# Patient Record
Sex: Male | Born: 1955 | Race: White | Hispanic: No | Marital: Single
Health system: Southern US, Community
[De-identification: ages and names within clinical notes are randomized; demographics above are authoritative.]

---

## 2005-08-15 ENCOUNTER — Ambulatory Visit: Payer: Self-pay | Admitting: Gastroenterology

## 2009-01-11 ENCOUNTER — Ambulatory Visit: Payer: Self-pay | Admitting: Internal Medicine

## 2009-02-08 ENCOUNTER — Inpatient Hospital Stay: Payer: Self-pay | Admitting: Internal Medicine

## 2009-02-10 ENCOUNTER — Ambulatory Visit: Payer: Self-pay | Admitting: Internal Medicine

## 2009-02-12 ENCOUNTER — Inpatient Hospital Stay: Payer: Self-pay | Admitting: Psychiatry

## 2009-02-16 ENCOUNTER — Ambulatory Visit: Payer: Self-pay | Admitting: Unknown Physician Specialty

## 2009-02-22 ENCOUNTER — Ambulatory Visit: Payer: Self-pay | Admitting: Internal Medicine

## 2009-03-13 ENCOUNTER — Ambulatory Visit: Payer: Self-pay | Admitting: Internal Medicine

## 2009-03-13 ENCOUNTER — Ambulatory Visit: Payer: Self-pay | Admitting: Unknown Physician Specialty

## 2009-04-12 ENCOUNTER — Ambulatory Visit: Payer: Self-pay | Admitting: Internal Medicine

## 2009-05-13 ENCOUNTER — Ambulatory Visit: Payer: Self-pay | Admitting: Internal Medicine

## 2009-06-13 ENCOUNTER — Ambulatory Visit: Payer: Self-pay | Admitting: Internal Medicine

## 2010-04-04 ENCOUNTER — Ambulatory Visit: Payer: Self-pay | Admitting: Podiatry

## 2010-04-23 ENCOUNTER — Inpatient Hospital Stay: Payer: Self-pay | Admitting: Internal Medicine

## 2011-11-22 ENCOUNTER — Ambulatory Visit: Payer: Self-pay | Admitting: Family Medicine

## 2011-11-22 LAB — CREATININE, SERUM
Creatinine: 1.13 mg/dL (ref 0.60–1.30)
EGFR (Non-African Amer.): >60

## 2011-12-21 ENCOUNTER — Other Ambulatory Visit: Payer: Self-pay | Admitting: Gastroenterology

## 2011-12-21 LAB — CLOSTRIDIUM DIFFICILE BY PCR

## 2011-12-31 ENCOUNTER — Ambulatory Visit: Payer: Self-pay | Admitting: Gastroenterology

## 2011-12-31 LAB — CBC WITH DIFFERENTIAL/PLATELET
Basophil #: 0.1 10*3/uL (ref 0.0–0.1)
Eosinophil %: 2.3 %
HCT: 39.3 % — ABNORMAL LOW (ref 40.0–52.0)
HGB: 13.2 g/dL (ref 13.0–18.0)
Lymphocyte %: 25.9 %
MCV: 95 fL (ref 80–100)
Monocyte %: 5.4 %
Neutrophil %: 65.2 %
Platelet: 113 10*3/uL — ABNORMAL LOW (ref 150–440)
RBC: 4.15 10*6/uL — ABNORMAL LOW (ref 4.40–5.90)
RDW: 13.2 % (ref 11.5–14.5)
WBC: 6.3 10*3/uL (ref 3.8–10.6)

## 2011-12-31 LAB — PROTIME-INR
INR: 1.2
Prothrombin Time: 16 secs — ABNORMAL HIGH (ref 11.5–14.7)

## 2012-01-01 ENCOUNTER — Ambulatory Visit: Payer: Self-pay | Admitting: Gastroenterology

## 2012-01-03 LAB — PATHOLOGY REPORT

## 2012-04-30 ENCOUNTER — Ambulatory Visit: Payer: Self-pay | Admitting: Ophthalmology

## 2012-04-30 LAB — POTASSIUM: Potassium: 4.5 mmol/L (ref 3.5–5.1)

## 2012-05-12 ENCOUNTER — Ambulatory Visit: Payer: Self-pay | Admitting: Ophthalmology

## 2012-09-16 ENCOUNTER — Ambulatory Visit: Payer: Self-pay | Admitting: Gastroenterology

## 2012-09-18 ENCOUNTER — Ambulatory Visit: Payer: Self-pay | Admitting: Internal Medicine

## 2012-09-23 ENCOUNTER — Ambulatory Visit: Payer: Self-pay | Admitting: Internal Medicine

## 2012-09-23 LAB — CBC CANCER CENTER
Basophil %: 0.8 %
Eosinophil %: 1.7 %
HCT: 43.8 % (ref 40.0–52.0)
HGB: 14.9 g/dL (ref 13.0–18.0)
MCH: 32.3 pg (ref 26.0–34.0)
MCHC: 34 g/dL (ref 32.0–36.0)
MCV: 95 fL (ref 80–100)
Neutrophil #: 5.3 x10 3/mm (ref 1.4–6.5)
Platelet: 107 x10 3/mm — ABNORMAL LOW (ref 150–440)
WBC: 8 x10 3/mm (ref 3.8–10.6)

## 2012-09-23 LAB — IRON AND TIBC
Iron Bind.Cap.(Total): 399 ug/dL (ref 250–450)
Iron Saturation: 19 %
Unbound Iron-Bind.Cap.: 325 ug/dL

## 2012-09-23 LAB — FERRITIN: Ferritin (ARMC): 141 ng/mL (ref 8–388)

## 2012-10-11 ENCOUNTER — Ambulatory Visit: Payer: Self-pay | Admitting: Internal Medicine

## 2012-10-29 LAB — CBC CANCER CENTER
Basophil %: 1 %
Eosinophil #: 0.1 x10 3/mm (ref 0.0–0.7)
Eosinophil %: 1.6 %
HCT: 41.6 % (ref 40.0–52.0)
Lymphocyte #: 1.3 x10 3/mm (ref 1.0–3.6)
Lymphocyte %: 24.6 %
MCHC: 34.8 g/dL (ref 32.0–36.0)
Monocyte #: 0.5 x10 3/mm (ref 0.2–1.0)
Monocyte %: 9.8 %
Neutrophil %: 63 %
Platelet: 54 x10 3/mm — ABNORMAL LOW (ref 150–440)
RBC: 4.32 10*6/uL — ABNORMAL LOW (ref 4.40–5.90)
RDW: 15 % — ABNORMAL HIGH (ref 11.5–14.5)
WBC: 5.4 x10 3/mm (ref 3.8–10.6)

## 2012-11-10 ENCOUNTER — Ambulatory Visit: Payer: Self-pay | Admitting: Internal Medicine

## 2012-11-18 LAB — CANCER CTR PLATELET CT: Platelet: 37 x10 3/mm — ABNORMAL LOW (ref 150–440)

## 2012-11-23 LAB — CANCER CTR PLATELET CT: Platelet: 50 x10 3/mm — ABNORMAL LOW (ref 150–440)

## 2012-11-26 LAB — CANCER CTR PLATELET CT: Platelet: 124 x10 3/mm — ABNORMAL LOW (ref 150–440)

## 2012-12-11 ENCOUNTER — Ambulatory Visit: Payer: Self-pay | Admitting: Internal Medicine

## 2013-01-11 DEATH — deceased

## 2013-07-30 IMAGING — US ABDOMEN ULTRASOUND
1 series · 13 of 25 positions shown · non-contrast
Comparison: none

REASON FOR EXAM: Thrombocytopenia
COMMENTS:

[Series 1: abdomen ultrasound · 0.28mm/px · 13 of 109 slices shown]
[im 1/109]
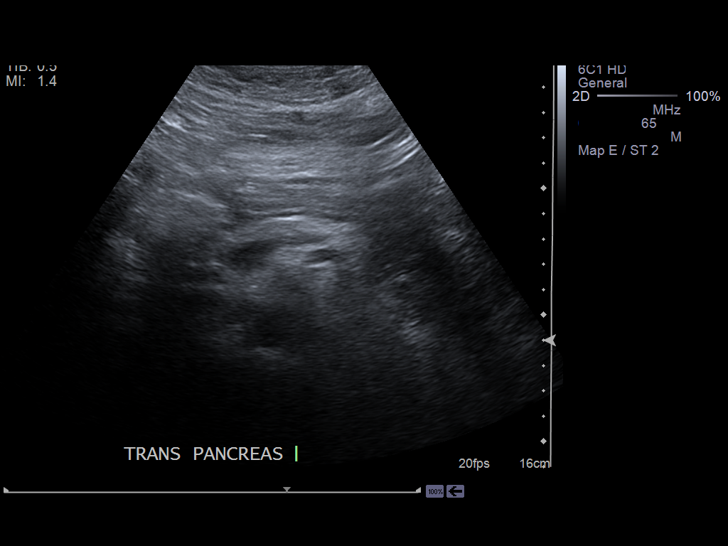
[im 10/109]
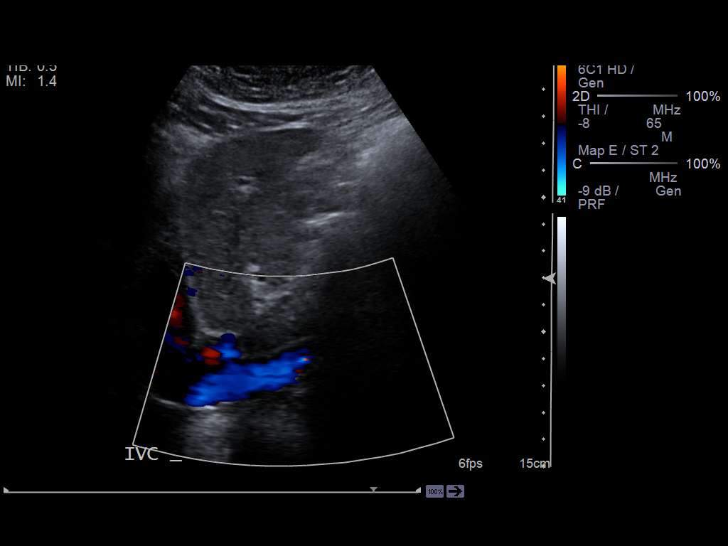
[im 19/109]
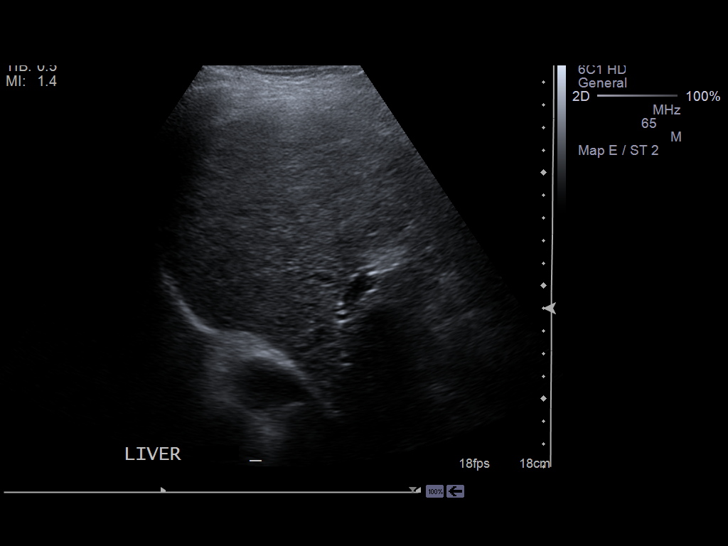
[im 28/109]
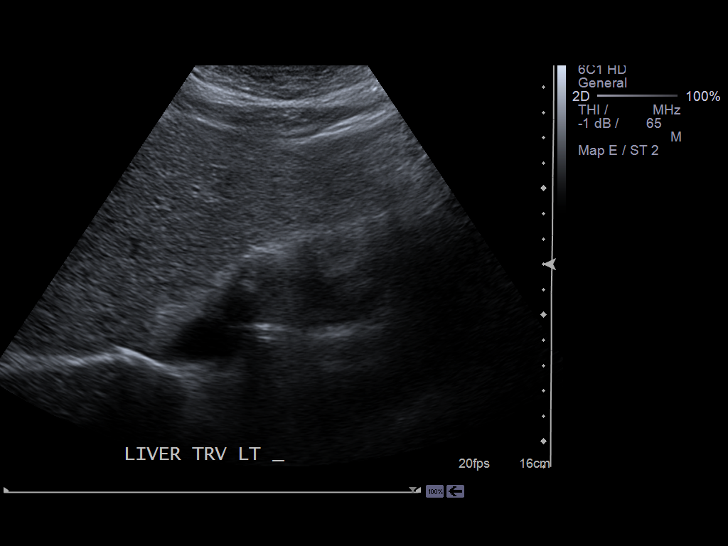
[im 37/109]
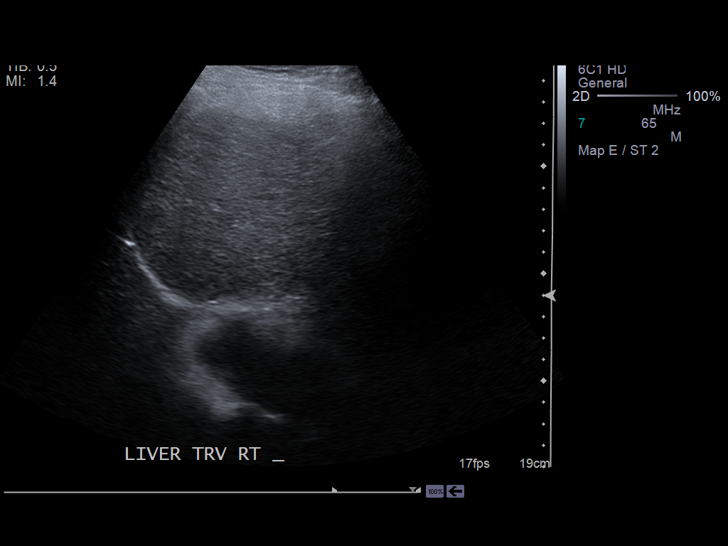
[im 46/109]
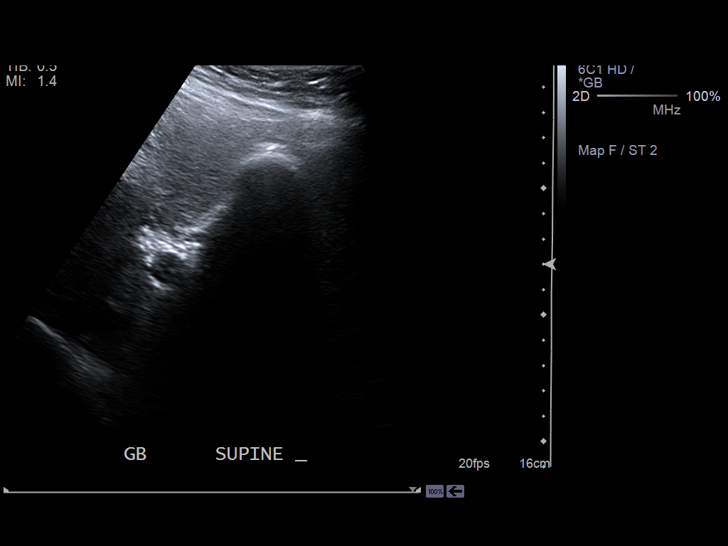
[im 55/109]
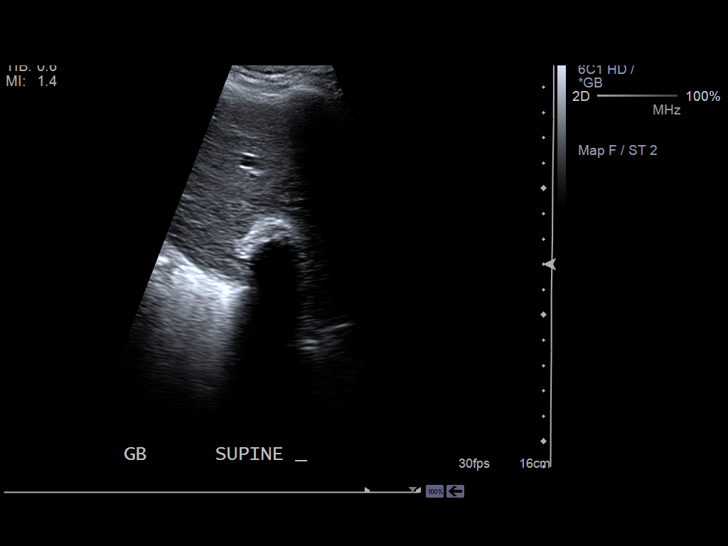
[im 64/109]
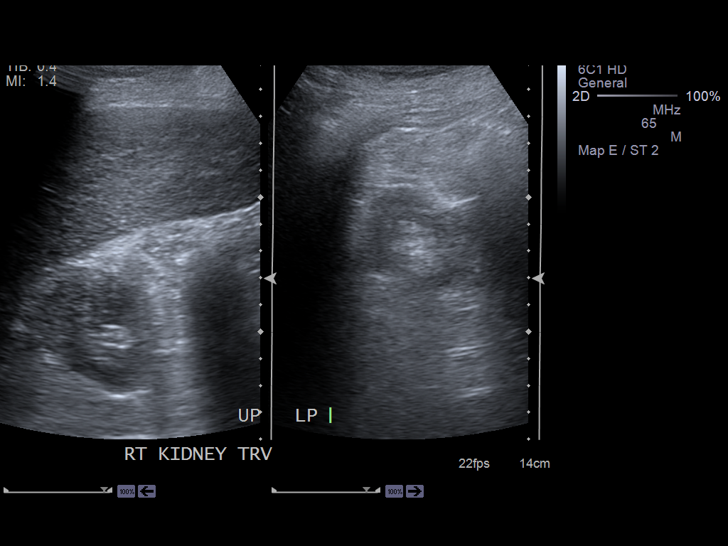
[im 73/109]
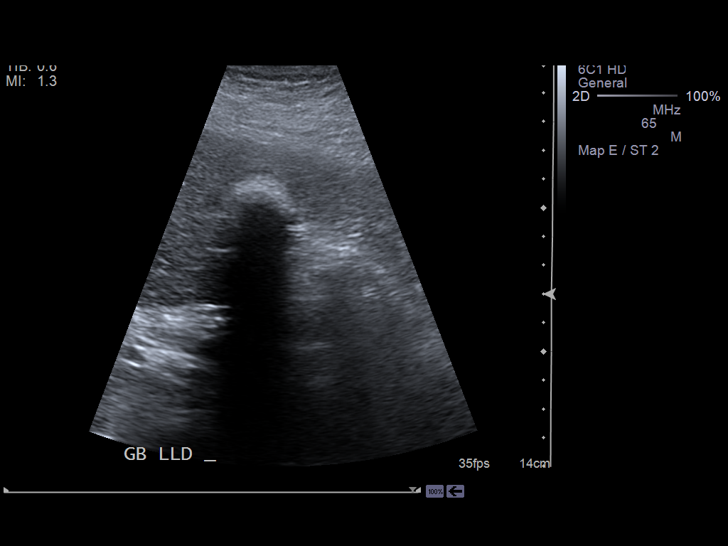
[im 82/109]
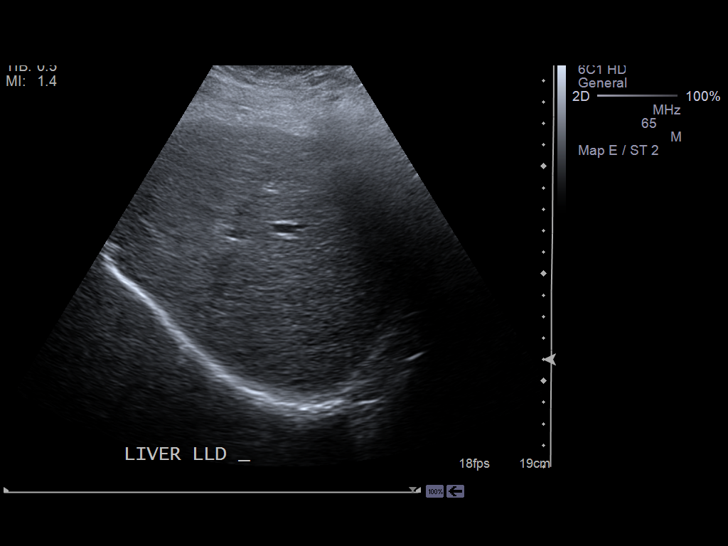
[im 91/109]
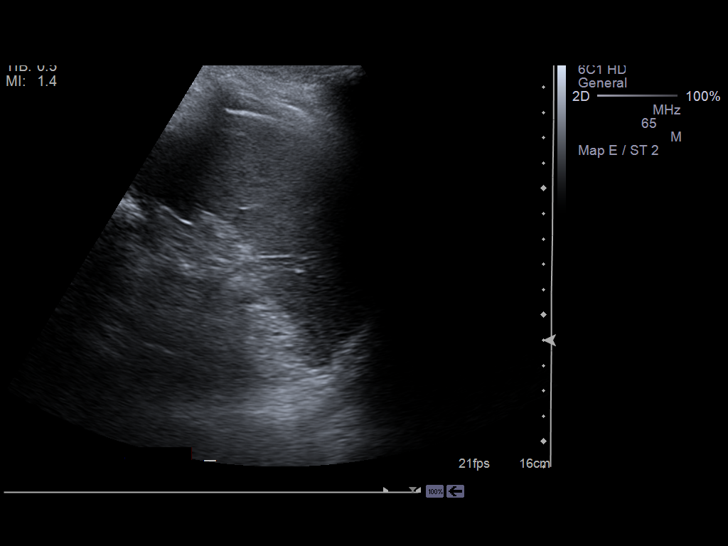
[im 100/109]
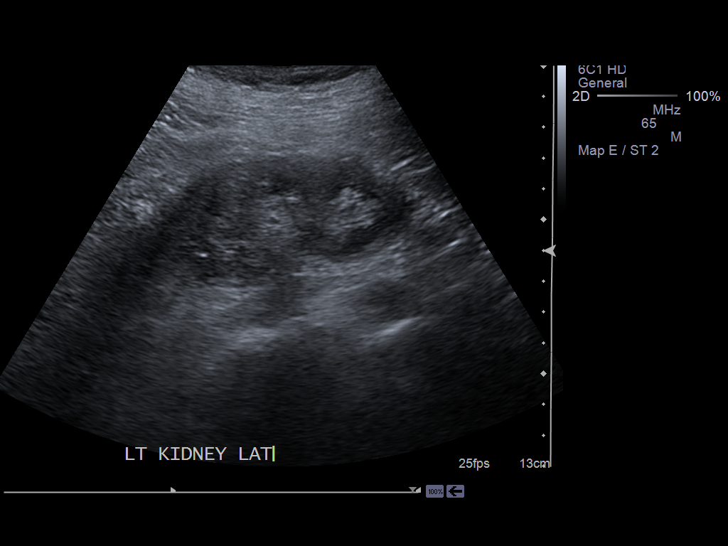
[im 109/109]
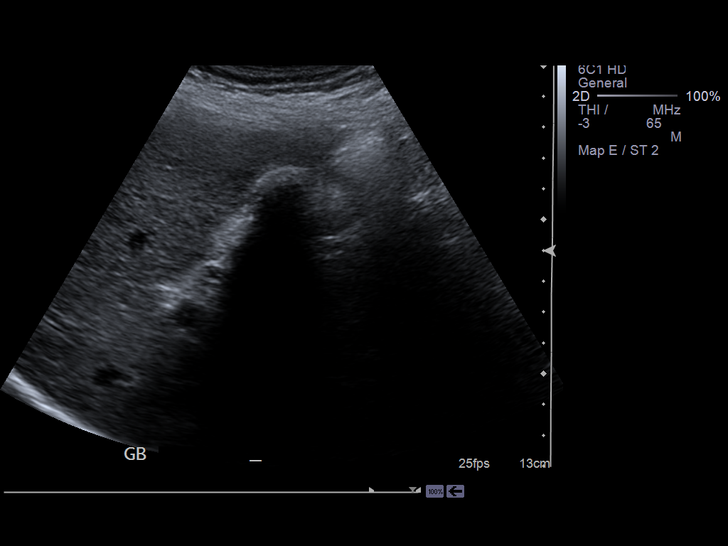

[13 of 25 positions shown; findings below may reference images not displayed]

PROCEDURE:     US  - US ABDOMEN GENERAL SURVEY  - September 16, 2012  [DATE]

RESULT:     Ultrasound of the abdomen is performed. The patient has previous
abdominal sonogram images from 24 April, 2010 for comparison.

The visualized body the pancreas appears normal. The head and tail are
poorly seen. The aorta shows normal caliber without aneurysm. There appears
to be evidence of tapering distally. The proximal inferior vena cava is
patent. The hepatic echotexture is coarse with a liver length of 15.60 cm
demonstrated. The liver appears to be somewhat echodense. No definite
hepatic mass or intrahepatic biliary ductal dilation is evident. There is
echogenicity with severe shadowing in the gallbladder fossa region
suggestive of wall echo shadow sign consistent with the gallbladder likely
filled with stones. This was seen on the previous exam. The right kidney
appears normal in size and echotexture measuring 11.37 x 3.77 x 4.63 cm. The
common bile duct diameter measures 3.7 mm. The gallbladder wall thickness
was not measured. The length of the spleen is 11.62 cm with a normal
appearing echotexture. The left kidney measures 10.08 x 4.35 x 5.08 cm and
appears unremarkable.
IMPRESSION: 1. Findings suggestive of cholelithiasis. There is a negative sonographic
Murphy's sign.
2. Dense appearance of the liver.
3. Incomplete visualization of the pancreas.

[REDACTED]

## 2014-08-30 NOTE — Op Note (Signed)
PATIENT NAME:  Christopher PaceSTAMEY, Marque D MR#:  [id number] DATE OF BIRTH:  Apr 16, 1956  DATE OF PROCEDURE:  05/12/2012  PREOPERATIVE DIAGNOSIS: Visually significant cataract of the right eye.   POSTOPERATIVE DIAGNOSIS: Visually significant cataract of the right eye.   OPERATIVE PROCEDURE: Cataract extraction by phacoemulsification with implant of intraocular lens to right eye.   SURGEON: Galen ManilaWilliam Sharmane Dame, MD.   ANESTHESIA:  1. Managed anesthesia care.  2. Topical tetracaine drops followed by 2% Xylocaine jelly applied in the preoperative holding area.   COMPLICATIONS: None.   TECHNIQUE:  Stop and chop.   DESCRIPTION OF PROCEDURE: The patient was examined and consented in the preoperative holding area where the aforementioned topical anesthesia was applied to the right eye and then brought back to the Operating Room where the right eye was prepped and draped in the usual sterile ophthalmic fashion and a lid speculum was placed. A paracentesis was created with the side port blade and the anterior chamber was filled with viscoelastic. A near clear corneal incision was performed with the steel keratome. A continuous curvilinear capsulorrhexis was performed with a cystotome followed by the capsulorrhexis forceps. Hydrodissection and hydrodelineation were carried out with BSS on a blunt cannula. The lens was removed in a stop and chop technique and the remaining cortical material was removed with the irrigation-aspiration handpiece. The capsular bag was inflated with viscoelastic and the Tecnis ZCB00 23.0-diopter lens, serial number [id number] was placed in the capsular bag without complication. The remaining viscoelastic was removed from the eye with the irrigation-aspiration handpiece. The wounds were hydrated. The anterior chamber was flushed with Miostat and the eye was inflated to physiologic pressure. The wounds were found to be water tight. The eye was dressed with Vigamox. The patient was given protective  glasses to wear throughout the day and a shield with which to sleep tonight. The patient was also given drops with which to begin a drop regimen today and will follow-up with me in one day.   ____________________________ Jerilee FieldWilliam L. Tayia Stonesifer, MD wlp:sb D: 05/12/2012 13:31:01 ET T: 05/12/2012 13:49:08 ET JOB#: [id number]  cc: Almir Botts L. Blonnie Maske, MD, <Dictator> Jerilee FieldWILLIAM L Arvie Bartholomew MD ELECTRONICALLY SIGNED 05/12/2012 16:28
